# Patient Record
Sex: Female | Born: 1985 | Race: White | Hispanic: No | Marital: Married | Smoking: Never smoker
Health system: Southern US, Community
[De-identification: ages and names within clinical notes are randomized; demographics above are authoritative.]

## PROBLEM LIST (undated history)

## (undated) DIAGNOSIS — Z789 Other specified health status: Secondary | ICD-10-CM

## (undated) HISTORY — PX: WISDOM TOOTH EXTRACTION: SHX21

## (undated) HISTORY — PX: OTHER SURGICAL HISTORY: SHX169

---

## 2016-12-24 DIAGNOSIS — Z3169 Encounter for other general counseling and advice on procreation: Secondary | ICD-10-CM | POA: Diagnosis not present

## 2017-02-13 DIAGNOSIS — Z3689 Encounter for other specified antenatal screening: Secondary | ICD-10-CM | POA: Diagnosis not present

## 2017-02-13 DIAGNOSIS — Z3401 Encounter for supervision of normal first pregnancy, first trimester: Secondary | ICD-10-CM | POA: Diagnosis not present

## 2017-02-13 DIAGNOSIS — Z118 Encounter for screening for other infectious and parasitic diseases: Secondary | ICD-10-CM | POA: Diagnosis not present

## 2017-03-13 DIAGNOSIS — Z3402 Encounter for supervision of normal first pregnancy, second trimester: Secondary | ICD-10-CM | POA: Diagnosis not present

## 2017-04-18 DIAGNOSIS — Z3402 Encounter for supervision of normal first pregnancy, second trimester: Secondary | ICD-10-CM | POA: Diagnosis not present

## 2017-04-18 DIAGNOSIS — Z363 Encounter for antenatal screening for malformations: Secondary | ICD-10-CM | POA: Diagnosis not present

## 2017-05-16 DIAGNOSIS — Z3402 Encounter for supervision of normal first pregnancy, second trimester: Secondary | ICD-10-CM | POA: Diagnosis not present

## 2017-06-17 DIAGNOSIS — Z3689 Encounter for other specified antenatal screening: Secondary | ICD-10-CM | POA: Diagnosis not present

## 2017-06-17 DIAGNOSIS — O4443 Low lying placenta NOS or without hemorrhage, third trimester: Secondary | ICD-10-CM | POA: Diagnosis not present

## 2017-06-17 DIAGNOSIS — Z3A28 28 weeks gestation of pregnancy: Secondary | ICD-10-CM | POA: Diagnosis not present

## 2017-07-02 NOTE — L&D Delivery Note (Signed)
Delivery Note  First Stage: Labor onset: 09/09/2017 @ 0500AM Augmentation : AROM and Pitocin  Analgesia /Anesthesia intrapartum: epidural  AROM at 2032PM - clear fluid   Second Stage: Complete dilation at 0420AM Onset of pushing at 0430AM FHR second stage Category 2: Baseline 135 bpm/ moderate variability/ +15x15 accels/ occ variable decels with pushing to nadir of  100 bpm with good return to baseline  Delivery of a viabl, vigorous female "Quintin AltoLillian James" at [id number] by Carlean JewsMeredith Sigmon, CNM in LOA position No nuchal cord Cord double clamped after cessation of pulsation, cut by FOB Cord blood sample collected   Third Stage: Placenta delivered via Tomasa BlaseSchultz intact with 3 VC @ 0724AM Placenta disposition: hospital disposal  Uterine tone firm with massage / bleeding minimal   Small right vaginal mucosal laceration identified  Perineal and labial edema noted Anesthesia for repair: epidural  Repair 3.0 vicryl  Est. Blood Loss (mL): 200mL  Complications: none  Mom to postpartum.  Baby to Couplet care / Skin to Skin.  Newborn: Birth Weight: 7#15.2oz [phone](3605g) Apgar Scores: 9, 9 Feeding planned: Breast  Carlean JewsMeredith Sigmon, MSN, CNM [facility]

## 2017-07-11 DIAGNOSIS — Z3403 Encounter for supervision of normal first pregnancy, third trimester: Secondary | ICD-10-CM | POA: Diagnosis not present

## 2017-07-11 DIAGNOSIS — Z23 Encounter for immunization: Secondary | ICD-10-CM | POA: Diagnosis not present

## 2017-07-25 DIAGNOSIS — Z3403 Encounter for supervision of normal first pregnancy, third trimester: Secondary | ICD-10-CM | POA: Diagnosis not present

## 2017-08-09 DIAGNOSIS — Z3685 Encounter for antenatal screening for Streptococcus B: Secondary | ICD-10-CM | POA: Diagnosis not present

## 2017-08-09 DIAGNOSIS — Z3403 Encounter for supervision of normal first pregnancy, third trimester: Secondary | ICD-10-CM | POA: Diagnosis not present

## 2017-08-15 DIAGNOSIS — Z3403 Encounter for supervision of normal first pregnancy, third trimester: Secondary | ICD-10-CM | POA: Diagnosis not present

## 2017-08-15 DIAGNOSIS — Z3685 Encounter for antenatal screening for Streptococcus B: Secondary | ICD-10-CM | POA: Diagnosis not present

## 2017-08-22 DIAGNOSIS — Z3403 Encounter for supervision of normal first pregnancy, third trimester: Secondary | ICD-10-CM | POA: Diagnosis not present

## 2017-08-30 DIAGNOSIS — Z3403 Encounter for supervision of normal first pregnancy, third trimester: Secondary | ICD-10-CM | POA: Diagnosis not present

## 2017-09-05 ENCOUNTER — Encounter (HOSPITAL_COMMUNITY): Payer: Self-pay | Admitting: *Deleted

## 2017-09-05 ENCOUNTER — Inpatient Hospital Stay (HOSPITAL_COMMUNITY)
Admission: AD | Admit: 2017-09-05 | Discharge: 2017-09-05 | Disposition: A | Discharge status: Home / Self Care | Payer: BLUE CROSS/BLUE SHIELD | Source: Ambulatory Visit | Attending: Obstetrics and Gynecology | Admitting: Obstetrics and Gynecology

## 2017-09-05 ENCOUNTER — Inpatient Hospital Stay (HOSPITAL_COMMUNITY): Payer: BLUE CROSS/BLUE SHIELD

## 2017-09-05 ENCOUNTER — Other Ambulatory Visit: Payer: Self-pay

## 2017-09-05 DIAGNOSIS — O368390 Maternal care for abnormalities of the fetal heart rate or rhythm, unspecified trimester, not applicable or unspecified: Secondary | ICD-10-CM

## 2017-09-05 DIAGNOSIS — Z3403 Encounter for supervision of normal first pregnancy, third trimester: Secondary | ICD-10-CM | POA: Diagnosis not present

## 2017-09-05 DIAGNOSIS — Z3A39 39 weeks gestation of pregnancy: Secondary | ICD-10-CM | POA: Diagnosis not present

## 2017-09-05 DIAGNOSIS — O368330 Maternal care for abnormalities of the fetal heart rate or rhythm, third trimester, not applicable or unspecified: Secondary | ICD-10-CM | POA: Diagnosis not present

## 2017-09-05 DIAGNOSIS — Z3483 Encounter for supervision of other normal pregnancy, third trimester: Secondary | ICD-10-CM | POA: Insufficient documentation

## 2017-09-05 MED ORDER — LACTATED RINGERS IV BOLUS (SEPSIS)
1000.0000 mL | Freq: Once | INTRAVENOUS | Status: AC
  Administered 2017-09-05: 1000 mL via INTRAVENOUS

## 2017-09-05 NOTE — MAU Note (Signed)
Pt sent from office for EFM/NST secondary variable noted on tracing in office.  Denies LOF or VB.  Reports +FM.

## 2017-09-05 NOTE — Discharge Instructions (Signed)

## 2017-09-05 NOTE — MAU Provider Note (Signed)
History     CSN: [id number]  Arrival date and time: 09/05/17 1743   None     Chief Complaint  Patient presents with  . EFM - NST   HPI Cleona Doubleday is a 32 yo G1P0 at 39+[redacted] weeks gestation presenting from the office today with concerns for possible variable decelerations heard by doppler here for prolonged monitoring.  She has had an uncomplicated pregnancy thus far, other than a posterior low-lying placenta, which resolved by 28 weeks. Her GBS is negative.  She requested a membrane sweep today, and was found to be 2cm/80%/-2/vtx with intact membranes. She endorses good fetal movement.  She reported some BH contractions last night, but states she has not felt many contractions today.  She denies VB or LOF.    History reviewed. No pertinent past medical history.  History reviewed. No pertinent surgical history.  History reviewed. No pertinent family history.  Social History   Tobacco Use  . Smoking status: Never Smoker  . Smokeless tobacco: Never Used  Substance Use Topics  . Alcohol use: No    Frequency: Never  . Drug use: No    Allergies: Not on File  No medications prior to admission.    Review of Systems  Constitutional: Negative.   HENT: Negative.   Eyes: Negative.   Respiratory: Negative.   Cardiovascular: Negative.   Gastrointestinal: Negative.   Endocrine: Negative.   Genitourinary: Negative.   Allergic/Immunologic: Negative.   Neurological: Negative.   Hematological: Negative.   Psychiatric/Behavioral: Negative.    Physical Exam   Blood pressure (!) 131/93, pulse 99, temperature 98.5 F (36.9 C), temperature source Oral, resp. rate 20, height 5\' 6"  (1.676 m), weight 84.4 kg (186 lb), SpO2 100 %. Vitals:   09/05/17 1754 09/05/17 2153 09/05/17 2332  BP: (!) 131/93 129/84 120/75  Pulse: 99 96 88  Resp: 20    Temp: 98.5 F (36.9 C)    TempSrc: Oral    SpO2: 100%    Weight: 84.4 kg (186 lb)    Height: 5\' 6"  (1.676 m)     Physical Exam   Constitutional: She is oriented to person, place, and time. She appears well-developed and well-nourished.  Respiratory: Effort normal.  GI: Soft. There is no tenderness.  Genitourinary: Uterus normal.  Genitourinary Comments: Gravid, non-tender; intermittent mild contractions palpated SVE: deferred  Musculoskeletal: Normal range of motion.  Neurological: She is alert and oriented to person, place, and time.  Skin: Skin is dry.    Fetal monitoring: Baseline: 110-120 bpm/ moderate variability/ +15x15 accelerations x more than 2/ no decelerations  Toco: irregular contractions/ mild  MAU Course  Procedures  MDM - Prolonged continuous fetal monitoring - BPP - 1 liter IVF bolus   Assessment and Plan  1. IUP at 39+3 with possible variable decelerations     - Prolonged continuous fetal monitoring    - BPP 8/8; AFI 7cm at 10%, low-normal     - 1 liter IVF bolus completed  2. Category 1 fetal tracing: pt. Had periods of indeterminate baseline fetal monitoring, which led [place] to perform BPP and prolonged monitoring.  After extended monitoring and 1 liter IVFs, fetal monitoring sustained at a Category 1 tracing.  3. Discharge home now     - Strict Fetal Kick Counts      - Labor precautions given      - F/u on Monday for repeat BPP/AFI   Consult for plan of care: Dr. Billy Coast - he has reviewed fetal monitoring  strip and ultrasound and agrees with plan   Karena AddisonMeredith C Tarnisha Kachmar, CNM  09/05/2017, 9:06 PM

## 2017-09-09 ENCOUNTER — Inpatient Hospital Stay (HOSPITAL_COMMUNITY): Payer: BLUE CROSS/BLUE SHIELD | Admitting: Anesthesiology

## 2017-09-09 ENCOUNTER — Inpatient Hospital Stay (HOSPITAL_COMMUNITY)
Admission: AD | Admit: 2017-09-09 | Discharge: 2017-09-09 | Disposition: A | Discharge status: Home / Self Care | Payer: BLUE CROSS/BLUE SHIELD | Source: Ambulatory Visit | Attending: Obstetrics and Gynecology | Admitting: Obstetrics and Gynecology

## 2017-09-09 ENCOUNTER — Encounter (HOSPITAL_COMMUNITY): Payer: Self-pay

## 2017-09-09 ENCOUNTER — Inpatient Hospital Stay (HOSPITAL_COMMUNITY)
Admission: AD | Admit: 2017-09-09 | Discharge: 2017-09-11 | DRG: 807 | Disposition: A | Discharge status: Home / Self Care | Payer: BLUE CROSS/BLUE SHIELD | Source: Ambulatory Visit | Attending: Obstetrics and Gynecology | Admitting: Obstetrics and Gynecology

## 2017-09-09 ENCOUNTER — Encounter (HOSPITAL_COMMUNITY): Payer: Self-pay | Admitting: *Deleted

## 2017-09-09 DIAGNOSIS — O368330 Maternal care for abnormalities of the fetal heart rate or rhythm, third trimester, not applicable or unspecified: Secondary | ICD-10-CM | POA: Diagnosis not present

## 2017-09-09 DIAGNOSIS — Z3491 Encounter for supervision of normal pregnancy, unspecified, first trimester: Secondary | ICD-10-CM | POA: Diagnosis not present

## 2017-09-09 DIAGNOSIS — O479 False labor, unspecified: Secondary | ICD-10-CM

## 2017-09-09 DIAGNOSIS — Z3A40 40 weeks gestation of pregnancy: Secondary | ICD-10-CM | POA: Diagnosis not present

## 2017-09-09 HISTORY — DX: Other specified health status: Z78.9

## 2017-09-09 LAB — OB RESULTS CONSOLE HEPATITIS B SURFACE ANTIGEN: HEP B S AG: NEGATIVE

## 2017-09-09 LAB — TYPE AND SCREEN
ABO/RH(D): O POS
ANTIBODY SCREEN: NEGATIVE

## 2017-09-09 LAB — CBC
HCT: 38.1 % (ref 36.0–46.0)
Hemoglobin: 13.1 g/dL (ref 12.0–15.0)
MCH: 33.7 pg (ref 26.0–34.0)
MCHC: 34.4 g/dL (ref 30.0–36.0)
MCV: 97.9 fL (ref 78.0–100.0)
PLATELETS: 195 10*3/uL (ref 150–400)
RBC: 3.89 MIL/uL (ref 3.87–5.11)
RDW: 13.5 % (ref 11.5–15.5)
WBC: 17.6 10*3/uL — AB (ref 4.0–10.5)

## 2017-09-09 LAB — OB RESULTS CONSOLE RPR: RPR: NONREACTIVE

## 2017-09-09 LAB — OB RESULTS CONSOLE ANTIBODY SCREEN: Antibody Screen: NEGATIVE

## 2017-09-09 LAB — OB RESULTS CONSOLE RUBELLA ANTIBODY, IGM: RUBELLA: IMMUNE

## 2017-09-09 LAB — OB RESULTS CONSOLE GC/CHLAMYDIA
CHLAMYDIA, DNA PROBE: NEGATIVE
Gonorrhea: NEGATIVE

## 2017-09-09 LAB — ABO/RH: ABO/RH(D): O POS

## 2017-09-09 LAB — OB RESULTS CONSOLE ABO/RH: "RH Type ": POSITIVE

## 2017-09-09 LAB — OB RESULTS CONSOLE GBS: GBS: NEGATIVE

## 2017-09-09 LAB — OB RESULTS CONSOLE HIV ANTIBODY (ROUTINE TESTING): HIV: NONREACTIVE

## 2017-09-09 MED ORDER — LACTATED RINGERS IV SOLN: 500.0000 mL | INTRAVENOUS | Status: DC | PRN

## 2017-09-09 MED ORDER — SOD CITRATE-CITRIC ACID 500-334 MG/5ML PO SOLN: 30.0000 mL | ORAL | Status: DC | PRN

## 2017-09-09 MED ORDER — LACTATED RINGERS IV SOLN
500.0000 mL | Freq: Once | INTRAVENOUS | Status: AC
  Administered 2017-09-09: 500 mL via INTRAVENOUS

## 2017-09-09 MED ORDER — EPHEDRINE 5 MG/ML INJ
10.0000 mg | INTRAVENOUS | Status: DC | PRN
  Filled 2017-09-09: qty 2

## 2017-09-09 MED ORDER — LIDOCAINE HCL (PF) 1 % IJ SOLN
30.0000 mL | INTRAMUSCULAR | Status: DC | PRN
  Filled 2017-09-09: qty 30

## 2017-09-09 MED ORDER — ACETAMINOPHEN 325 MG PO TABS: 650.0000 mg | ORAL_TABLET | ORAL | Status: DC | PRN

## 2017-09-09 MED ORDER — OXYTOCIN BOLUS FROM INFUSION
500.0000 mL | Freq: Once | INTRAVENOUS | Status: AC
  Administered 2017-09-10: 500 mL via INTRAVENOUS

## 2017-09-09 MED ORDER — ONDANSETRON HCL 4 MG/2ML IJ SOLN
4.0000 mg | Freq: Four times a day (QID) | INTRAMUSCULAR | Status: DC | PRN
  Administered 2017-09-10: 4 mg via INTRAVENOUS
  Filled 2017-09-09: qty 2

## 2017-09-09 MED ORDER — DIPHENHYDRAMINE HCL 50 MG/ML IJ SOLN: 12.5000 mg | INTRAMUSCULAR | Status: DC | PRN

## 2017-09-09 MED ORDER — LIDOCAINE HCL (PF) 1 % IJ SOLN
INTRAMUSCULAR | Status: DC | PRN
  Administered 2017-09-09 (×2): 5 mL via EPIDURAL

## 2017-09-09 MED ORDER — LACTATED RINGERS IV SOLN
INTRAVENOUS | Status: DC
  Administered 2017-09-09 – 2017-09-10 (×3): via INTRAVENOUS

## 2017-09-09 MED ORDER — FENTANYL 2.5 MCG/ML BUPIVACAINE 1/10 % EPIDURAL INFUSION (WH - ANES)
14.0000 mL/h | INTRAMUSCULAR | Status: DC | PRN
  Administered 2017-09-09 – 2017-09-10 (×2): 14 mL/h via EPIDURAL
  Filled 2017-09-09 (×2): qty 100

## 2017-09-09 MED ORDER — PHENYLEPHRINE 40 MCG/ML (10ML) SYRINGE FOR IV PUSH (FOR BLOOD PRESSURE SUPPORT)
80.0000 ug | PREFILLED_SYRINGE | INTRAVENOUS | Status: DC | PRN
  Filled 2017-09-09: qty 10
  Filled 2017-09-09: qty 5

## 2017-09-09 MED ORDER — PHENYLEPHRINE 40 MCG/ML (10ML) SYRINGE FOR IV PUSH (FOR BLOOD PRESSURE SUPPORT)
80.0000 ug | PREFILLED_SYRINGE | INTRAVENOUS | Status: DC | PRN
  Filled 2017-09-09: qty 5

## 2017-09-09 MED ORDER — OXYTOCIN 40 UNITS IN LACTATED RINGERS INFUSION - SIMPLE MED
2.5000 [IU]/h | INTRAVENOUS | Status: DC
  Filled 2017-09-09: qty 1000

## 2017-09-09 NOTE — MAU Note (Signed)
Contractions, denies bleeding or ROM 

## 2017-09-09 NOTE — Anesthesia Pain Management Evaluation Note (Addendum)
  CRNA Pain Management Visit Note  Patient: Sheri Leach, 32 y.o., female  "Hello I am a member of the anesthesia team at [facility]. We have an anesthesia team available at all times to provide care throughout the hospital, including epidural management and anesthesia for C-section. I don't know your plan for the delivery whether it a natural birth, water birth, IV sedation, nitrous supplementation, doula or epidural, but we want to meet your pain goals."   1.Was your pain managed to your expectations on prior hospitalizations?   No prior hospitalizations  2.What is your expectation for pain management during this hospitalization?     Epidural  3.How can we help you reach that goal? Maintain epidural  Record the patient's initial score and the patient's pain goal.   Pain: 1  Pain Goal: 5 [facility] wants you to be able to Mourer your pain was always managed very well.  Racheal Mathurin 09/09/2017

## 2017-09-09 NOTE — Anesthesia Procedure Notes (Signed)
Epidural Patient location during procedure: OB Start time: 09/09/2017 6:11 PM End time: 09/09/2017 6:22 PM  Staffing Anesthesiologist: Heather RobertsSinger, Anarosa Kubisiak, MD Performed: anesthesiologist   Preanesthetic Checklist Completed: patient identified, site marked, pre-op evaluation, timeout performed, IV checked, risks and benefits discussed and monitors and equipment checked  Epidural Patient position: sitting Prep: DuraPrep Patient monitoring: heart rate, cardiac monitor, continuous pulse ox and blood pressure Approach: midline Location: L2-L3 Injection technique: LOR saline  Needle:  Needle type: Tuohy  Needle gauge: 17 G Needle length: 9 cm Needle insertion depth: 5 cm Catheter size: 20 Guage Catheter at skin depth: 10 cm Test dose: negative and Other  Assessment Events: blood not aspirated, injection not painful, no injection resistance and negative IV test  Additional Notes Informed consent obtained prior to proceeding including risk of failure, 1% risk of PDPH, risk of minor discomfort and bruising.  Discussed rare but serious complications including epidural abscess, permanent nerve injury, epidural hematoma.  Discussed alternatives to epidural analgesia and patient desires to proceed.  Timeout performed pre-procedure verifying patient name, procedure, and platelet count.  Patient tolerated procedure well.

## 2017-09-09 NOTE — Discharge Instructions (Signed)
Braxton Hicks Contractions °Contractions of the uterus can occur throughout pregnancy, but they are not always a sign that you are in labor. You may have practice contractions called Braxton Hicks contractions. These false labor contractions are sometimes confused with true labor. °What are Braxton Hicks contractions? °Braxton Hicks contractions are tightening movements that occur in the muscles of the uterus before labor. Unlike true labor contractions, these contractions do not result in opening (dilation) and thinning of the cervix. Toward the end of pregnancy (32-34 weeks), Braxton Hicks contractions can happen more often and may become stronger. These contractions are sometimes difficult to tell apart from true labor because they can be very uncomfortable. You should not feel embarrassed if you go to the hospital with false labor. °Sometimes, the only way to tell if you are in true labor is for your health care provider to look for changes in the cervix. The health care provider will do a physical exam and may monitor your contractions. If you are not in true labor, the exam should show that your cervix is not dilating and your water has not broken. °If there are other health problems associated with your pregnancy, it is completely safe for you to be sent home with false labor. You may continue to have Braxton Hicks contractions until you go into true labor. °How to tell the difference between true labor and false labor °True labor °· Contractions last 30-70 seconds. °· Contractions become very regular. °· Discomfort is usually felt in the top of the uterus, and it spreads to the lower abdomen and low back. °· Contractions do not go away with walking. °· Contractions usually become more intense and increase in frequency. °· The cervix dilates and gets thinner. °False labor °· Contractions are usually shorter and not as strong as true labor contractions. °· Contractions are usually irregular. °· Contractions  are often felt in the front of the lower abdomen and in the groin. °· Contractions may go away when you walk around or change positions while lying down. °· Contractions get weaker and are shorter-lasting as time goes on. °· The cervix usually does not dilate or become thin. °Follow these instructions at home: °· Take over-the-counter and prescription medicines only as told by your health care provider. °· Keep up with your usual exercises and follow other instructions from your health care provider. °· Eat and drink lightly if you think you are going into labor. °· If Braxton Hicks contractions are making you uncomfortable: °? Change your position from lying down or resting to walking, or change from walking to resting. °? Sit and rest in a tub of warm water. °? Drink enough fluid to keep your urine pale yellow. Dehydration may cause these contractions. °? Do slow and deep breathing several times an hour. °· Keep all follow-up prenatal visits as told by your health care provider. This is important. °Contact a health care provider if: °· You have a fever. °· You have continuous pain in your abdomen. °Get help right away if: °· Your contractions become stronger, more regular, and closer together. °· You have fluid leaking or gushing from your vagina. °· You pass blood-tinged mucus (bloody show). °· You have bleeding from your vagina. °· You have low back pain that you never had before. °· You feel your baby’s head pushing down and causing pelvic pressure. °· Your baby is not moving inside you as much as it used to. °Summary °· Contractions that occur before labor are called Braxton   Hicks contractions, false labor, or practice contractions. °· Braxton Hicks contractions are usually shorter, weaker, farther apart, and less regular than true labor contractions. True labor contractions usually become progressively stronger and regular and they become more frequent. °· Manage discomfort from Braxton Hicks contractions by  changing position, resting in a warm bath, drinking plenty of water, or practicing deep breathing. °This information is not intended to replace advice given to you by your health care provider. Make sure you discuss any questions you have with your health care provider. °Document Released: 11/01/2016 Document Revised: 11/01/2016 Document Reviewed: 11/01/2016 °Elsevier Interactive Patient Education © 2018 [employer] Inc[employer] ° °

## 2017-09-09 NOTE — MAU Provider Note (Signed)
  History     CSN: [id number]  Arrival date and time: 09/09/17 1134   None     Chief Complaint  Patient presents with  . Labor Eval   HPI Sheri Leach is a 32 yo G1P0 at 40 weeks presenting with painful labor contractions since 5am.  She states she has been contracting every 7-10 minutes, and have been more painful than any contractions she has experienced.  She also feels some back labor.  She reports some pink discharge with wiping, but denies LOF or VB. Since arrival to MAU, she states the contractions have felt less intense and she is able to talk through them.   OB History    Gravida Para Term Preterm AB Living   1             SAB TAB Ectopic Multiple Live Births                  Past Medical History:  Diagnosis Date  . Medical history non-contributory     Past Surgical History:  Procedure Laterality Date  . tooth implant    . WISDOM TOOTH EXTRACTION      History reviewed. No pertinent family history.  Social History   Tobacco Use  . Smoking status: Never Smoker  . Smokeless tobacco: Never Used  Substance Use Topics  . Alcohol use: No    Frequency: Never  . Drug use: No    Allergies: No Known Allergies  No medications prior to admission.    Review of Systems  Constitutional: Negative.   HENT: Negative.   Respiratory: Negative.   Cardiovascular: Negative.   Gastrointestinal: Negative.   Genitourinary: Positive for vaginal discharge.       Pink vaginal discharge; painful uterine contractions   Musculoskeletal: Positive for back pain.  Skin: Negative.   Neurological: Negative.   Psychiatric/Behavioral: Negative.    Physical Exam   Blood pressure 120/83, pulse 95, temperature 98.5 F (36.9 C), temperature source Oral, resp. rate 18, height 5\' 6"  (1.676 m), weight 83 kg (183 lb), SpO2 100 %.  Physical Exam  Constitutional: She is oriented to person, place, and time. She appears well-developed and well-nourished.  GI: Soft.  Genitourinary: Uterus  normal.  Genitourinary Comments: Contractions feel mild on palpation  Musculoskeletal: Normal range of motion.  Neurological: She is alert and oriented to person, place, and time.  Skin: Skin is dry.  Dilation: 3 Effacement (%): 80 Cervical Position: Posterior Station: -1, -2 Presentation: Vertex Exam by:: n druebbisch rn  Fetal monitoring:  Baseline: 130 bpm/ moderate variability/ +accels 15x15/ no decelerations Toco: every 5-10 minutes/ mild  MAU Course  Procedures   Assessment and Plan  1. Single IUP at 40 weeks with false labor      - After 1 hr of monitoring and discussion with patient, she has decided to go home for lunch and f/u with her scheduled appt at the office at 2:30pm for repeat BPP/AFI.  Discussed she is likely in early labor.  Will recheck cervix in office this afternoon.  FKC's discussed. Labor precautions reviewed.   I updated Dr. Billy Coastaavon with plan of care and he agrees  Karena AddisonMeredith C Tomika Eckles 09/09/2017, 1:11 PM

## 2017-09-09 NOTE — Progress Notes (Signed)
S:  Pt. Comfortable with epidural, but feeling intermittent rectal pressure.   O:  VS: Blood pressure 110/77, pulse 98, temperature 98.8 F (37.1 C), temperature source Oral, resp. rate 18, SpO2 100 %.        FHR : baseline 145 bpm/ variability moderate / accelerations +15x15 / early and occasional variable decelerations        Toco: contractions every 3-4.5 minutes / moderate         Cervix : Dilation: 7 Effacement (%): 90 Station: 0 Presentation: Vertex Exam by:: Carlean JewsMeredith Kayl Stogdill CNM difficult to assess position, but feels asynclitic; feels tight at the ischial spines; more cervix on right side        Membranes: ruptured at 2032, clear fluid  A: Active labor     FHR category 2 with progression to 1     GBS Negative   P: Pt. Desires to wait on Pitocin for a few more hours to rest      Right exaggerated sims; keep rotating with peanut     Likely will need Pitocin augmentation with IUPC     Reassess in 2 hours    Carlean JewsMeredith Terelle Dobler, MSN, CNM [facility]

## 2017-09-09 NOTE — Anesthesia Preprocedure Evaluation (Signed)
Anesthesia Evaluation  Patient identified by MRN, date of birth, ID band Patient awake    Reviewed: Allergy & Precautions, NPO status , Patient's Chart, lab work & pertinent test results  Airway Mallampati: II  TM Distance: >3 FB Neck ROM: Full    Dental no notable dental hx. (+) Dental Advisory Given   Pulmonary neg pulmonary ROS,    Pulmonary exam normal        Cardiovascular negative cardio ROS Normal cardiovascular exam     Neuro/Psych negative neurological ROS  negative psych ROS   GI/Hepatic negative GI ROS, Neg liver ROS,   Endo/Other  negative endocrine ROS  Renal/GU negative Renal ROS  negative genitourinary   Musculoskeletal negative musculoskeletal ROS (+)   Abdominal   Peds negative pediatric ROS (+)  Hematology negative hematology ROS (+)   Anesthesia Other Findings   Reproductive/Obstetrics (+) Pregnancy                             Anesthesia Physical Anesthesia Plan  ASA: II  Anesthesia Plan: Epidural   Post-op Pain Management:    Induction:   PONV Risk Score and Plan:   Airway Management Planned: Natural Airway  Additional Equipment:   Intra-op Plan:   Post-operative Plan:   Informed Consent: I have reviewed the patients History and Physical, chart, labs and discussed the procedure including the risks, benefits and alternatives for the proposed anesthesia with the patient or authorized representative who has indicated his/her understanding and acceptance.       Plan Discussed with: Anesthesiologist  Anesthesia Plan Comments:         Anesthesia Quick Evaluation  

## 2017-09-09 NOTE — H&P (Signed)
  OB ADMISSION/ HISTORY & PHYSICAL:  Admission Date: 09/09/2017  4:53 PM  Admit Diagnosis: Active labor at 40 weeks  Sheri Leach is a 32 y.o. female  G1P0 at 7540 weeks presenting for active labor. She has had contractions since 5am, which and progressively gotten stronger throughout the day.  At 12:30pm she was 3cm, and by 3pm, she was 4.5cm with bulging membranes.   Prenatal History: G1P0000   EDC : 09/09/2017, Date entered prior to episode creation  [facility]  Primary Ob Provider: CNM Management/ M. Qianna Clagett, CNM  Prenatal course complicated by: - Low-lying placenta; resolved at 28 weeks  Prenatal Labs: ABO, Rh: --/--/O POS (03/11 1730) Antibody: PENDING (03/11 1730) Rubella: Immune (03/11 1704)  RPR: Nonreactive (03/11 1704)  HBsAg: Negative (03/11 1704)  HIV: Non-reactive (03/11 1704)  GTT: 94 GBS: Negative (03/11 1704)  CUS: normal anatomy; low-lying posterior placenta -resolved Declined genetic screening   Medical / Surgical History :  Past medical history:  Past Medical History:  Diagnosis Date  . Medical history non-contributory      Past surgical history:  Past Surgical History:  Procedure Laterality Date  . tooth implant    . WISDOM TOOTH EXTRACTION      Family History: History reviewed. No pertinent family history.   Social History:  reports that  has never smoked. she has never used smokeless tobacco. She reports that she does not drink alcohol or use drugs.   Allergies: Patient has no known allergies.    Current Medications at time of admission:  Prior to Admission medications   Not on File     Review of Systems: Active FM onset of ctx @ 0500AM currently every 3-4 minutes No LOF  / SROM  Pink discharge noted    Physical Exam:  VS: Blood pressure 129/78, pulse 90, temperature (!) 97.5 F (36.4 C), temperature source Axillary, resp. rate 20.  General: alert and oriented, breathing, but grimacing through contractions   Heart: RRR Lungs: Clear lung fields Abdomen: Gravid, soft and non-tender, non-distended / uterus: gravid, non-tender; EFW 8# by Leopold's  Extremities: no  edema  Genitalia / VE:  4.5cm/90/-1  FHR: baseline rate 130 bpm/ variability moderate / accelerations +15x15 / no decelerations TOCO: every 3-5 minutes/ moderate to palpation   Assessment: [redacted] weeks gestation Active stage of labor FHR category 1 GBS Negative    Plan:  1. [facility]   - Routine labor and delivery orders   - Pain management: nitrous oxide vs. IV pain medication; may consider epidural   - Plan for SL in case of IV pain medication or epidural  2. GBS Negative    - No prophylaxis indicated  3. Postpartum:   - Breast   - Plans circumcision if a boy 4. Anticipate MOD:   - NSVD   Dr. Cherly Hensenousins notified of admission / plan of care  Carlean JewsMeredith Shameeka Silliman, MSN, [facility]

## 2017-09-09 NOTE — Progress Notes (Addendum)
S:  Pt. Comfortable with epidural except for hot spot on right side.  Contractions have spaced some since epidural. Initial check was 5cm/90/-1 with BBOW.  Discussed options for augmentation including AROM vs. Pitocin.  Pt. Chooses AROM.   O:  VS: Blood pressure 122/79, pulse 90, temperature 98.7 F (37.1 C), temperature source Oral, resp. rate 18, SpO2 100 %.        FHR : baseline 130 bpm/ variability moderate / accelerations + 15x15 / rare variable decelerations        Toco: contractions every 4-5 minutes / moderate        Cervix : Dilation: 7 Effacement (%): 90 Station: 0 Presentation: Vertex Exam by:: Sharyl NimrodMeredith Mardella Nuckles CNM Possibly OP/asynclintic         Membranes: AROM for clear fluid  A: Active labor     FHR category 2 with progression to 1     GBS Negative   P: Encouraged epidural PCA for hot spot     Rotate to exaggerated SIMs position with peanut and alternate right/left    Reassess in 1 hour     Anticipate NSVD    Sheri JewsMeredith Dawsyn Zurn, MSN, CNM [facility]

## 2017-09-10 ENCOUNTER — Other Ambulatory Visit: Payer: Self-pay

## 2017-09-10 ENCOUNTER — Encounter (HOSPITAL_COMMUNITY): Payer: Self-pay | Admitting: *Deleted

## 2017-09-10 LAB — RPR: RPR: NONREACTIVE

## 2017-09-10 MED ORDER — COCONUT OIL OIL: 1.0000 "application " | TOPICAL_OIL | Status: DC | PRN

## 2017-09-10 MED ORDER — BENZOCAINE-MENTHOL 20-0.5 % EX AERO
1.0000 "application " | INHALATION_SPRAY | CUTANEOUS | Status: DC | PRN
  Administered 2017-09-10: 1 via TOPICAL
  Filled 2017-09-10: qty 56

## 2017-09-10 MED ORDER — SENNOSIDES-DOCUSATE SODIUM 8.6-50 MG PO TABS
2.0000 | ORAL_TABLET | ORAL | Status: DC
  Administered 2017-09-11: 2 via ORAL
  Filled 2017-09-10: qty 2

## 2017-09-10 MED ORDER — IBUPROFEN 600 MG PO TABS
600.0000 mg | ORAL_TABLET | Freq: Four times a day (QID) | ORAL | Status: DC
  Administered 2017-09-10 – 2017-09-11 (×4): 600 mg via ORAL
  Filled 2017-09-10 (×5): qty 1

## 2017-09-10 MED ORDER — PRENATAL MULTIVITAMIN CH
1.0000 | ORAL_TABLET | Freq: Every day | ORAL | Status: DC
  Administered 2017-09-10 – 2017-09-11 (×2): 1 via ORAL
  Filled 2017-09-10 (×2): qty 1

## 2017-09-10 MED ORDER — DIBUCAINE 1 % RE OINT: 1.0000 "application " | TOPICAL_OINTMENT | RECTAL | Status: DC | PRN

## 2017-09-10 MED ORDER — TERBUTALINE SULFATE 1 MG/ML IJ SOLN
0.2500 mg | Freq: Once | INTRAMUSCULAR | Status: DC | PRN
  Filled 2017-09-10: qty 1

## 2017-09-10 MED ORDER — DIPHENHYDRAMINE HCL 25 MG PO CAPS: 25.0000 mg | ORAL_CAPSULE | Freq: Four times a day (QID) | ORAL | Status: DC | PRN

## 2017-09-10 MED ORDER — WITCH HAZEL-GLYCERIN EX PADS: 1.0000 "application " | MEDICATED_PAD | CUTANEOUS | Status: DC | PRN

## 2017-09-10 MED ORDER — ONDANSETRON HCL 4 MG/2ML IJ SOLN: 4.0000 mg | INTRAMUSCULAR | Status: DC | PRN

## 2017-09-10 MED ORDER — ZOLPIDEM TARTRATE 5 MG PO TABS: 5.0000 mg | ORAL_TABLET | Freq: Every evening | ORAL | Status: DC | PRN

## 2017-09-10 MED ORDER — OXYTOCIN 40 UNITS IN LACTATED RINGERS INFUSION - SIMPLE MED
1.0000 m[IU]/min | INTRAVENOUS | Status: DC
  Administered 2017-09-10: 2 m[IU]/min via INTRAVENOUS

## 2017-09-10 MED ORDER — ACETAMINOPHEN 325 MG PO TABS: 650.0000 mg | ORAL_TABLET | ORAL | Status: DC | PRN

## 2017-09-10 MED ORDER — ONDANSETRON HCL 4 MG PO TABS: 4.0000 mg | ORAL_TABLET | ORAL | Status: DC | PRN

## 2017-09-10 MED ORDER — SIMETHICONE 80 MG PO CHEW: 80.0000 mg | CHEWABLE_TABLET | ORAL | Status: DC | PRN

## 2017-09-10 NOTE — Progress Notes (Signed)
S:  Pt. Feeling constant rectal pressure, but still comfortable with epidural   O:  VS: Blood pressure (!) 133/91, pulse 83, temperature 98.1 F (36.7 C), temperature source Oral, resp. rate 18, SpO2 100 %.        FHR : baseline 130 bpm/ variability moderate / accelerations + / occasional variable deceleration, which are resolving         Toco: contractions every 3.5-5 minutes / moderate         Cervix : Dilation: 10 Dilation Complete Date: 09/10/17 Dilation Complete Time: 0420 Effacement (%): 100 Station: Plus 2 Presentation: Vertex Exam by:: Irving BurtonEmily Rothermel RN         Membranes: clear fluid   A: active labor     FHR category 2 with progression to 1     GBS Negative  P: Begin pushing      Anticipate NSVD   Carlean JewsMeredith Mare Ludtke, MSN, CNM [facility]

## 2017-09-10 NOTE — Progress Notes (Signed)
S:  Pt. Pushing for almost 2 hours, starting to get tired.  Contractions palpate strong, but are every 3-5.5 minutes.  Discussed need for Pitocin and pt. Agrees.   O:  VS: Blood pressure (!) 133/91, pulse 83, temperature 98.1 F (36.7 C), temperature source Oral, resp. rate 18, SpO2 100 %.        FHR : baseline 135 bpm/ variability minimal to moderate / accelerations +15x15 / no decelerations        Toco: contractions every 3.5-5.5 minutes / strong        Cervix : c/c/caput is +3        Membranes: clear fluid   A: Active labor     FHR category 1  P: Will begin Pitocin 2 milliunits and increase by 2 milliunits       Continue pushing      Anticipate SVD   Carlean JewsMeredith Sigmon, MSN, CNM [facility]

## 2017-09-10 NOTE — Anesthesia Postprocedure Evaluation (Signed)
Anesthesia Post Note  Patient: Sheri Leach  Procedure(s) Performed: AN AD HOC LABOR EPIDURAL     Anesthesia Type: Epidural    Last Vitals:  Vitals:   09/10/17 0850 09/10/17 1020  BP: (!) 98/56 112/67  Pulse: 80 64  Resp: 16 16  Temp: 36.8 C 36.7 C  SpO2: 97% 97%    Last Pain:  Vitals:   09/10/17 1310  TempSrc:   PainSc: 0-No pain   Pain Goal:                 KeyCorpBURGER,Avanish Cerullo

## 2017-09-11 LAB — CBC
HCT: 31.6 % — ABNORMAL LOW (ref 36.0–46.0)
Hemoglobin: 10.9 g/dL — ABNORMAL LOW (ref 12.0–15.0)
MCH: 34.7 pg — AB (ref 26.0–34.0)
MCHC: 34.5 g/dL (ref 30.0–36.0)
MCV: 100.6 fL — ABNORMAL HIGH (ref 78.0–100.0)
PLATELETS: 163 10*3/uL (ref 150–400)
RBC: 3.14 MIL/uL — AB (ref 3.87–5.11)
RDW: 14 % (ref 11.5–15.5)
WBC: 18.7 10*3/uL — AB (ref 4.0–10.5)

## 2017-09-11 MED ORDER — IBUPROFEN 600 MG PO TABS: 600.0000 mg | ORAL_TABLET | Freq: Four times a day (QID) | ORAL | 0 refills | Status: AC

## 2017-09-11 NOTE — Discharge Summary (Signed)
Obstetric Discharge Summary Reason for Admission: onset of labor Prenatal Procedures: none Intrapartum Procedures: spontaneous vaginal delivery and epidural Postpartum Procedures: none Complications-Operative and Postpartum: none - small vaginal laceration repaired Hemoglobin  Date Value Ref Range Status  09/11/2017 10.9 (L) 12.0 - 15.0 g/dL Final   HCT  Date Value Ref Range Status  09/11/2017 31.6 (L) 36.0 - 46.0 % Final    Physical Exam:  General: alert, cooperative and no distress Lochia: appropriate Uterine Fundus: firm Incision: healing well DVT Evaluation: No evidence of DVT seen on physical exam.  Discharge Diagnoses: Term Pregnancy-delivered  Discharge Information: Date: 09/11/2017 Activity: pelvic rest Diet: routine Medications: PNV and Ibuprofen Condition: stable Instructions: refer to practice specific booklet Discharge to: home Follow-up Information    Sheri Leach, Sheri Leach, CNM. Schedule an appointment as soon as possible for a visit in 6 week(s).   Specialty:  Obstetrics and Gynecology Contact information: [address] [place] [address] [phone]           Newborn Data: Live born female  Birth Weight: 7 lb 15.2 oz (3605 g) APGAR: 9, 9  Newborn Delivery   Birth date/time:  09/10/2017 07:09:00 Delivery type:  Vaginal, Spontaneous     Home with mother.  Sheri Leach 09/11/2017, 11:35 AM

## 2017-09-11 NOTE — Progress Notes (Addendum)
S:  Reports feeling well overall.  Some c/o of pain with movement, well controlled with ibuprofen.   Tolerating regular diet without N/V.  Up ad lib/ambulating.  Voiding without difficulty.  Passing flatus.  Reports bleeding as light.  Breastfeeding going well, with no c/o breast or nipple pain.  Unsure about plan for PP contraception.[place] [facility].  Feels that family support is adequate.  Desires discharge home this evening.  O: Vital signs BP 111/75 (BP Location: Right Arm)   Pulse 76   Temp 97.9 F (36.6 C) (Oral)   Resp 16   Ht 5' 5.98" (1.676 m)   Wt 83 kg (183 lb)   SpO2 98%   Breastfeeding? Unknown   BMI 29.55 kg/m  Labs   Recent Labs    09/09/17 1730 09/11/17 0538  WBC 17.6* 18.7*  HGB 13.1 10.9*  PLT 195 163    General:  A&O x 3, cooperative, NAD. Skin:  Warm, dry, appropriate for ethnicity HEENT:  Symmetrical, no lumps noted.  Sclera white. No lesions, discharge, or swelling noted. Heart:  RRR, no murmurs. Lungs:  Breathing even and unlabored,  CTA bilaterally. Breasts:  Symmetric, no lesions, masses, or tenderness.  No discharge. Abdomen:  Soft, non-tender, non-distended.  Bowel sounds present all quadrants.  Some silver striae noted. Musculoskeletal:  Full ROM of neck, arm, and legs. Peripheral:  Radial pulses equal bilaterally.  No varicosities noted.  No/trace/+1/+2 edema noted.   Fundus: firm, non-tender, U-1 Lochia light, without clots. Perineum:  No swelling, well approximated. .  A: PP day 1, s/p SVD Right vaginal mucosal laceration, healing well Bonding well with infant. Doing well, stable  P:   Discharge home. Return to clinic for PP visit at 6 weeks Rx: iron, ibuprofen, PNV Teaching/education given on breast care for bottle/breastfeeding, perineal care, diet, exercise, resuming intercourse, and incision care.  Discussed  S/s of infection and when to call office.  Ceasar MonsSarah Dey, RN, SNM 09/11/2017 , 9:54 AM  Seen and agree - DC home today - if newborn remains  for jaundice management, can room-in. Marlinda Mikeanya Bailey CNM

## 2017-09-11 NOTE — Lactation Note (Signed)
This note was copied from a baby's chart. Lactation Consultation Note Baby 18 hrs old. +DAT, has dark red bruise top of head. Discussed jaundice w/mom importance of BF and baby I&O. Baby having BM while LC consult. Discussed bilirubin excreting in stool. Discussed elevated bili can cause baby to be sleepy. Wake baby if hasn't cue to feed in 3 hrs. Alert RN if noted cont. Not to feed.  Mom asked if baby had tight frenulum, does appears to be mid slightly tight frenulum. Discussed milk transfer, assessing breast before and after BF, and obtaining a deep latch to prevent nipple trauma.  Mom encouraged to feed baby 8-12 times/24 hours and with feeding cues.  Assisted in football hold. Discussed positioning, support, STS, I&O, cluster feeding, and breast massage. Mom has round breast breast w/everted nipples. Breast compressible for deep latch. Mom denies painful latches. WH/LC brochure given w/resources, support groups and LC services.  Patient Name: Sheri Leach ZOXWR'UToday's Date: 09/11/2017 Reason for consult: Initial assessment   Maternal Data Has patient been taught Hand Expression?: Yes Does the patient have breastfeeding experience prior to this delivery?: No  Feeding Feeding Type: Breast Fed Length of feed: 10 min(still BF)  LATCH Score Latch: Grasps breast easily, tongue down, lips flanged, rhythmical sucking.  Audible Swallowing: A few with stimulation  Type of Nipple: Everted at rest and after stimulation  Comfort (Breast/Nipple): Soft / non-tender  Hold (Positioning): Assistance needed to correctly position infant at breast and maintain latch.  LATCH Score: 8  Interventions Interventions: Breast feeding basics reviewed;Support pillows;Assisted with latch;Position options;Skin to skin;Breast massage;Hand express;Breast compression;Adjust position  Lactation Tools Discussed/Used WIC Program: No   Consult Status Consult Status: Follow-up Date: 09/12/17 Follow-up type:  In-patient    [name], Diamond NickelLAURA G 09/11/2017, 1:22 AM

## 2017-09-12 ENCOUNTER — Ambulatory Visit: Payer: Self-pay

## 2017-09-12 NOTE — Lactation Note (Signed)
This note was copied from a baby's chart. Lactation Consultation Note  Patient Name: Girl Elby BeckLaura Orgeron ZOXWR'UToday's Date: 09/12/2017 Reason for consult: Follow-up assessment  Follow up visit prior to discharge.  Mother stating she feels comfortable with breastfeeding and has no questions or concerns.  LC reviewed pumping, flange sizing( #24 looks good for today, however, when milk comes in #27 would be appropriate), RPS.  Plan:  Shells in between feedings except during sleep, for latching, hand express, pre-pump if needed and RPS.  Mom made aware of O/P services, breastfeeding support groups, community resources, and our phone # for post-discharge questions.    Breast tissue looks healthy and no breakdown noted.  Tissue appears healthy.Mom made aware of O/P services, breastfeeding support groups, community resources, and our phone # for post-discharge questions.  Maternal Data Has patient been taught Hand Expression?: Yes  Feeding Feeding Type: Breast Fed Length of feed: 20 min  LATCH Score                   Interventions Interventions: Breast feeding basics reviewed;Skin to skin;Breast massage;Breast compression;Shells;Hand pump  Lactation Tools Discussed/Used     Consult Status Consult Status: Complete Date: 09/12/17    Irene PapBeth R Izeyah Deike 09/12/2017, 9:04 AM

## 2018-04-21 DIAGNOSIS — Z1151 Encounter for screening for human papillomavirus (HPV): Secondary | ICD-10-CM | POA: Diagnosis not present

## 2018-04-21 DIAGNOSIS — Z01419 Encounter for gynecological examination (general) (routine) without abnormal findings: Secondary | ICD-10-CM | POA: Diagnosis not present

## 2018-04-21 DIAGNOSIS — Z6825 Body mass index (BMI) 25.0-25.9, adult: Secondary | ICD-10-CM | POA: Diagnosis not present

## 2019-06-22 IMAGING — US US MFM FETAL BPP W/O NON-STRESS
1 series · 11 of 11 positions shown · non-contrast
Comparison: none

[Series 1: us mfm fetal bpp w/o non-stress · 11 acquisitions, 11 frames shown]
[im 1/11]
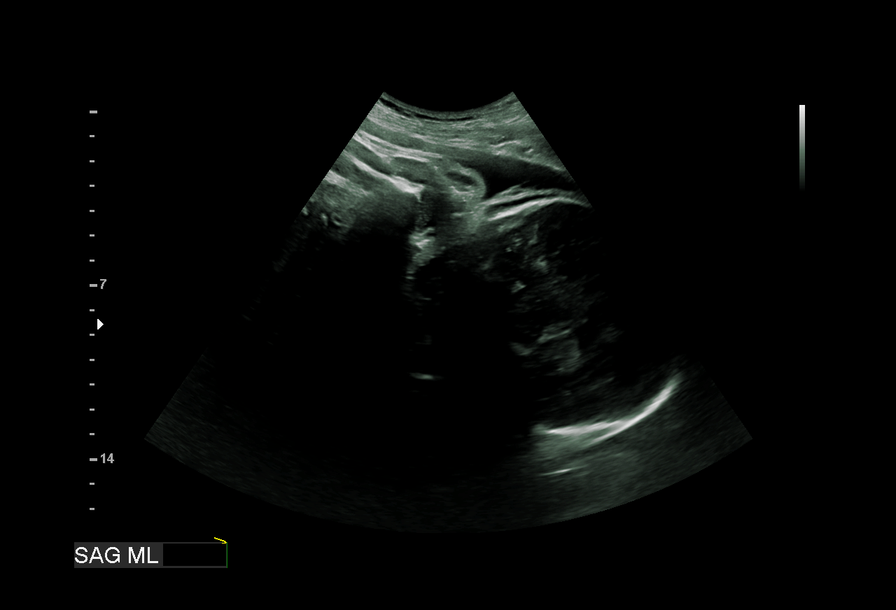
[im 2/11]
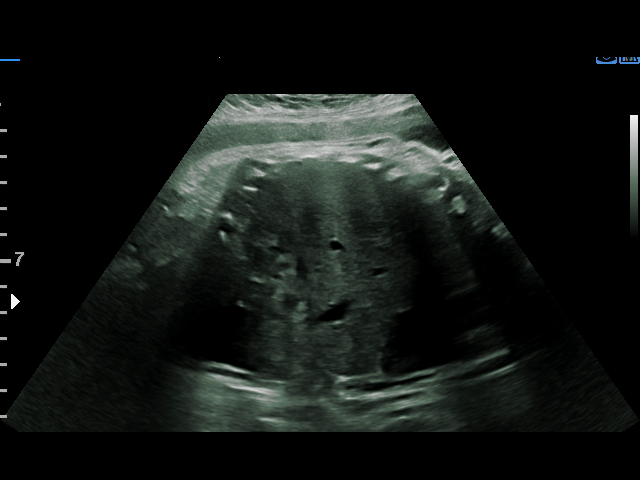
[im 3/11]
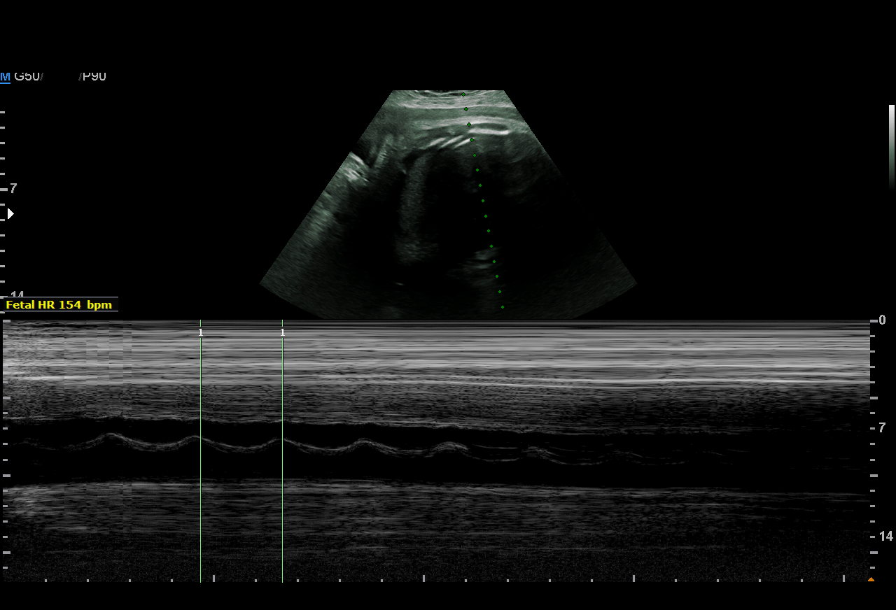
[im 4/11]
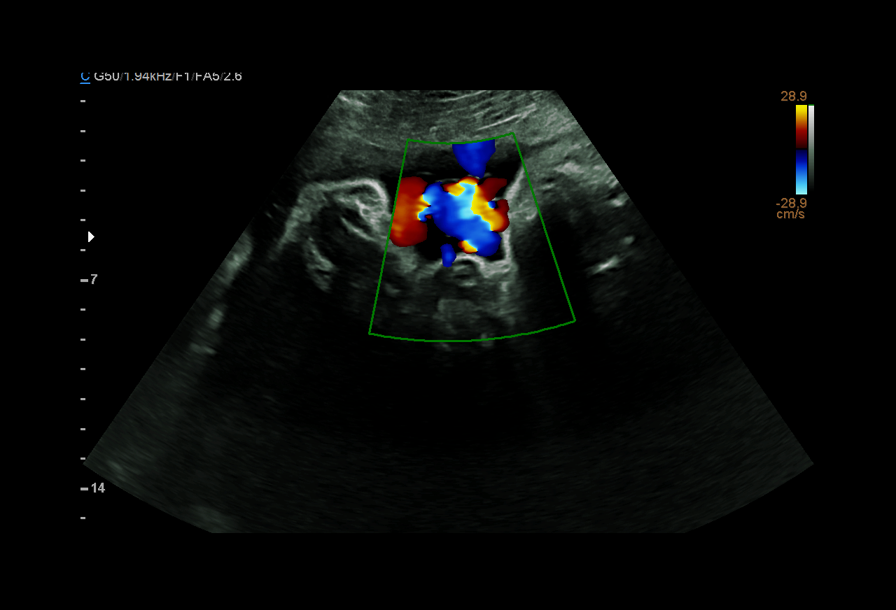
[im 5/11]
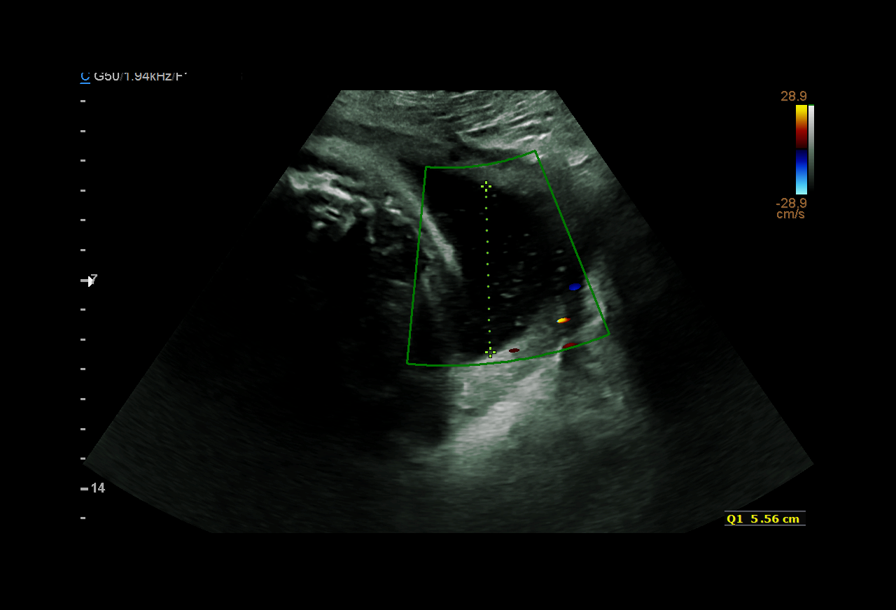
[im 6/11]
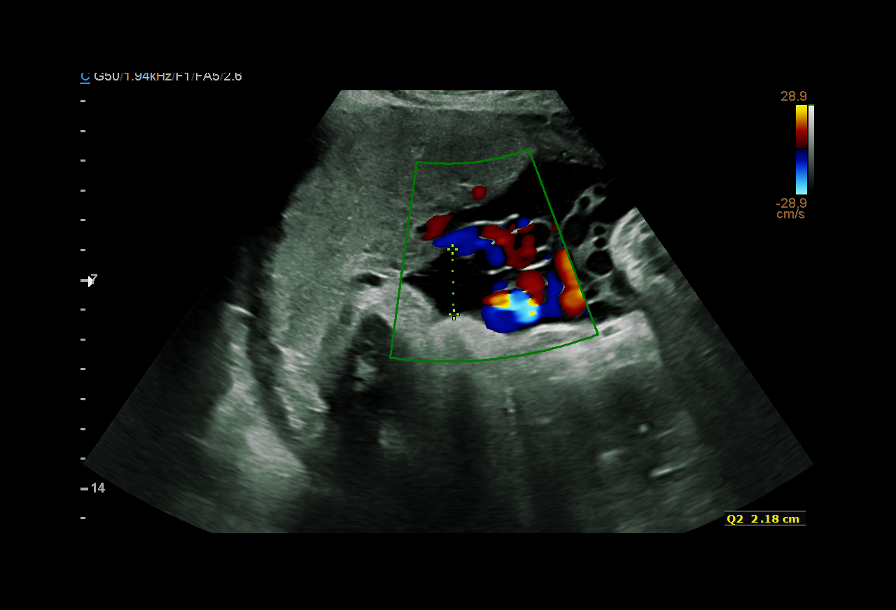
[im 7/11]
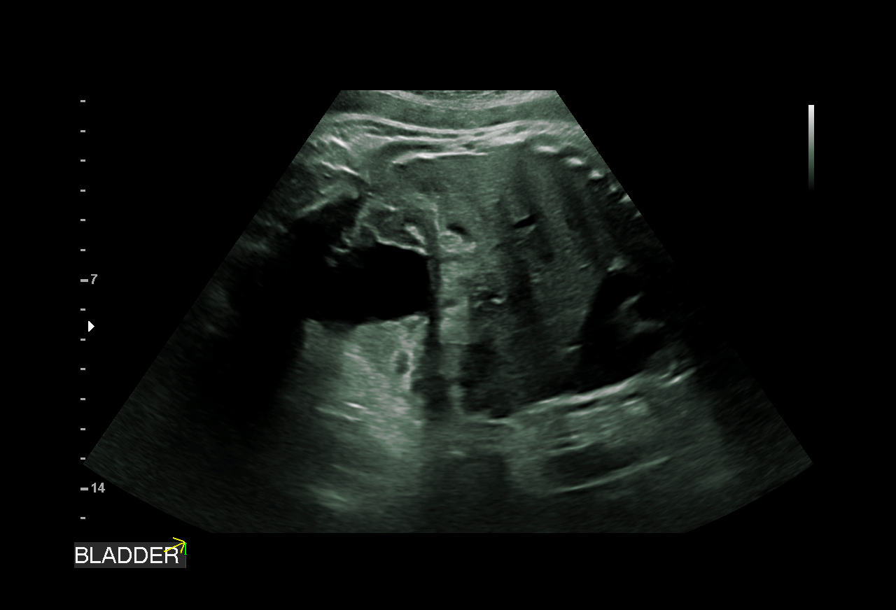
[im 8/11]
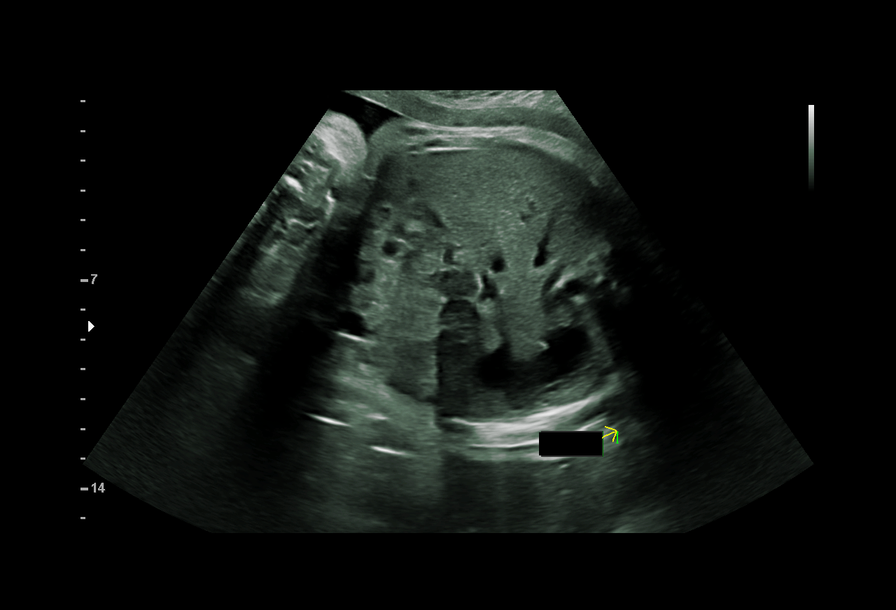
[im 9/11]
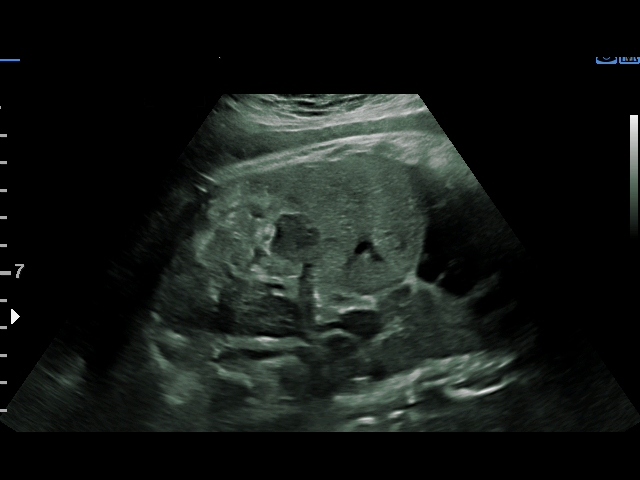
[im 10/11]
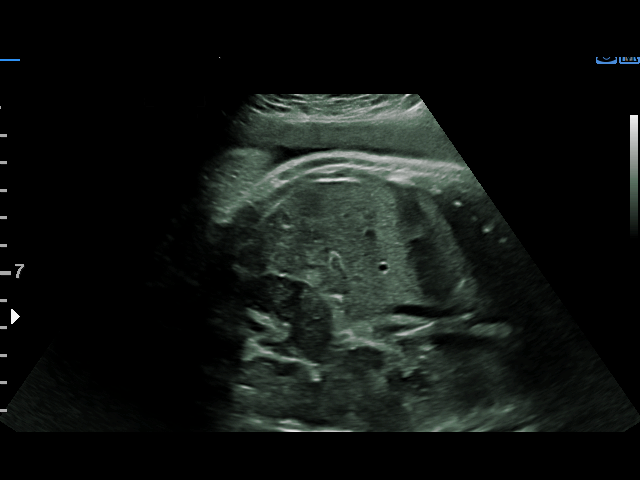
[im 11/11]
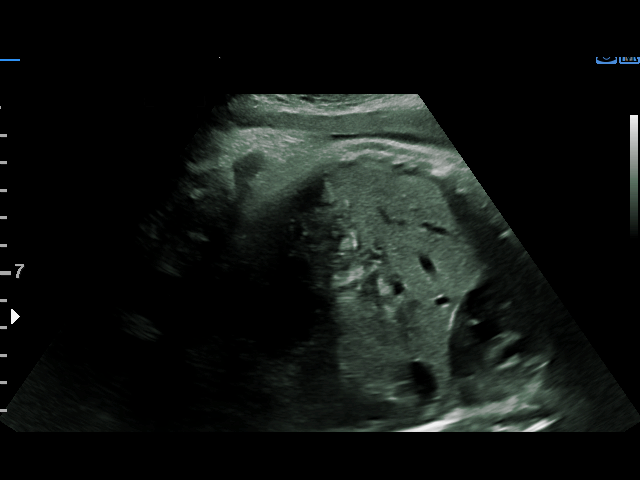

[11 of 11 positions shown; findings below may reference images not displayed]

1  MARIENNE KOROMA          [id number]      [id number]     [id number]
Indications

39 weeks gestation of pregnancy
Non-reactive NST, FHR decelerations
OB History

Gravidity:    1
Fetal Evaluation

Num Of Fetuses:     1
Fetal Heart         154
Rate(bpm):
Cardiac Activity:   Observed
Presentation:       Cephalic

Amniotic Fluid
AFI FV:      Subjectively low-normal

AFI Sum(cm)     %Tile       Largest Pocket(cm)
7.74            10

RUQ(cm)                     LUQ(cm)
5.56
Biophysical Evaluation

Amniotic F.V:   Within normal limits       F. Tone:        Observed
F. Movement:    Observed                   Score:          [DATE]
F. Breathing:   Observed
Gestational Age

Clinical EDD:  39w 3d                                        EDD:   09/09/17
Best:          39w 3d     Det. By:  Clinical EDD             EDD:   09/09/17
Anatomy

Stomach:               Appears normal, left   Bladder:                Appears normal
sided
Impression

Single living intrauterine pregnancy at 39w 3d.
Cephalic presentation.
Normal amniotic fluid volume.
BPP [DATE].
Recommendations

Continue clinical evaluation and management.
Management should be based on fetal heart rate tracing.
